# Patient Record
Sex: Male | Born: 1990
Health system: Southern US, Community
[De-identification: ages and names within clinical notes are randomized; demographics above are authoritative.]

## PROBLEM LIST (undated history)

## (undated) DIAGNOSIS — F3160 Bipolar disorder, current episode mixed, unspecified: Secondary | ICD-10-CM

## (undated) DIAGNOSIS — I454 Nonspecific intraventricular block: Secondary | ICD-10-CM

## (undated) HISTORY — PX: HERNIA REPAIR: SHX51

---
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
## 2009-09-27 ENCOUNTER — Emergency Department (HOSPITAL_COMMUNITY): Admission: EM | Admit: 2009-09-27 | Discharge: 2009-09-27 | Payer: Self-pay | Admitting: Emergency Medicine

## 2010-05-09 ENCOUNTER — Ambulatory Visit: Payer: Self-pay | Admitting: Pulmonary Disease

## 2010-05-09 ENCOUNTER — Inpatient Hospital Stay (HOSPITAL_COMMUNITY): Admission: EM | Admit: 2010-05-09 | Discharge: 2010-05-13 | Payer: Self-pay | Admitting: Emergency Medicine

## 2010-05-11 ENCOUNTER — Ambulatory Visit: Payer: Self-pay | Admitting: Psychiatry

## 2010-05-12 ENCOUNTER — Ambulatory Visit: Payer: Self-pay | Admitting: Psychiatry

## 2010-12-30 LAB — CBC
HCT: 39.2 % (ref 39.0–52.0)
HCT: 42.7 % (ref 39.0–52.0)
MCH: 30.4 pg (ref 26.0–34.0)
MCH: 30.6 pg (ref 26.0–34.0)
MCHC: 34.2 g/dL (ref 30.0–36.0)
MCHC: 35 g/dL (ref 30.0–36.0)
MCV: 88.5 fL (ref 78.0–100.0)
MCV: 88.8 fL (ref 78.0–100.0)
Platelets: 130 10*3/uL — ABNORMAL LOW (ref 150–400)
Platelets: 137 10*3/uL — ABNORMAL LOW (ref 150–400)
RBC: 4.03 MIL/uL — ABNORMAL LOW (ref 4.22–5.81)
RDW: 13.8 % (ref 11.5–15.5)
RDW: 13.8 % (ref 11.5–15.5)
RDW: 13.9 % (ref 11.5–15.5)

## 2010-12-30 LAB — BLOOD GAS, ARTERIAL
Acid-Base Excess: 0.2 mmol/L (ref 0.0–2.0)
Acid-Base Excess: 0.7 mmol/L (ref 0.0–2.0)
Acid-Base Excess: 1.8 mmol/L (ref 0.0–2.0)
Bicarbonate: 24.4 mEq/L — ABNORMAL HIGH (ref 20.0–24.0)
Drawn by: 295031
Drawn by: 326301
FIO2: 0.3 %
FIO2: 0.4 %
MECHVT: 500 mL
MECHVT: 640 mL
O2 Content: 0.4 L/min
O2 Saturation: 98.1 %
O2 Saturation: 99.3 %
RATE: 12 resp/min
RATE: 12 resp/min
RATE: 14 resp/min
TCO2: 21.1 mmol/L (ref 0–100)
TCO2: 21.2 mmol/L (ref 0–100)
pCO2 arterial: 34.4 mmHg — ABNORMAL LOW (ref 35.0–45.0)
pH, Arterial: 7.446 (ref 7.350–7.450)
pO2, Arterial: 182 mmHg — ABNORMAL HIGH (ref 80.0–100.0)

## 2010-12-30 LAB — COMPREHENSIVE METABOLIC PANEL
ALT: 13 U/L (ref 0–53)
Albumin: 3.6 g/dL (ref 3.5–5.2)
Albumin: 4.3 g/dL (ref 3.5–5.2)
Alkaline Phosphatase: 36 U/L — ABNORMAL LOW (ref 39–117)
BUN: 8 mg/dL (ref 6–23)
Calcium: 9 mg/dL (ref 8.4–10.5)
Chloride: 110 mEq/L (ref 96–112)
Creatinine, Ser: 1.03 mg/dL (ref 0.4–1.5)
Glucose, Bld: 103 mg/dL — ABNORMAL HIGH (ref 70–99)
Glucose, Bld: 87 mg/dL (ref 70–99)
Sodium: 140 mEq/L (ref 135–145)
Total Bilirubin: 2.4 mg/dL — ABNORMAL HIGH (ref 0.3–1.2)
Total Protein: 6.8 g/dL (ref 6.0–8.3)

## 2010-12-30 LAB — ETHANOL: Alcohol, Ethyl (B): <5 mg/dL (ref 0–10)

## 2010-12-30 LAB — DIFFERENTIAL
Basophils Relative: 0 % (ref 0–1)
Eosinophils Relative: 3 % (ref 0–5)
Lymphocytes Relative: 30 % (ref 12–46)
Monocytes Absolute: 0.4 10*3/uL (ref 0.1–1.0)
Neutrophils Relative %: 61 % (ref 43–77)

## 2010-12-30 LAB — URINALYSIS, ROUTINE W REFLEX MICROSCOPIC
Bilirubin Urine: NEGATIVE
Specific Gravity, Urine: 1.017 (ref 1.005–1.030)
pH: 5.5 (ref 5.0–8.0)

## 2010-12-30 LAB — CK TOTAL AND CKMB (NOT AT ARMC): Total CK: 145 U/L (ref 7–232)

## 2010-12-30 LAB — POCT I-STAT, CHEM 8
Calcium, Ion: 1.06 mmol/L — ABNORMAL LOW (ref 1.12–1.32)
Creatinine, Ser: 0.9 mg/dL (ref 0.4–1.5)
Hemoglobin: 14.6 g/dL (ref 13.0–17.0)
Sodium: 140 mEq/L (ref 135–145)
TCO2: 25 mmol/L (ref 0–100)

## 2010-12-30 LAB — BASIC METABOLIC PANEL
BUN: 7 mg/dL (ref 6–23)
CO2: 27 mEq/L (ref 19–32)
Calcium: 8.5 mg/dL (ref 8.4–10.5)
Chloride: 111 mEq/L (ref 96–112)
Creatinine, Ser: 0.93 mg/dL (ref 0.4–1.5)
GFR calc Af Amer: >60 mL/min (ref >60)
Glucose, Bld: 93 mg/dL (ref 70–99)

## 2010-12-30 LAB — RAPID URINE DRUG SCREEN, HOSP PERFORMED
Barbiturates: NOT DETECTED
Benzodiazepines: POSITIVE — AB
Cocaine: NOT DETECTED

## 2010-12-30 LAB — SALICYLATE LEVEL: Salicylate Lvl: <4 mg/dL (ref 2.8–20.0)

## 2010-12-30 LAB — URINE CULTURE

## 2010-12-30 LAB — MAGNESIUM: Magnesium: 1.8 mg/dL (ref 1.5–2.5)

## 2010-12-30 LAB — POCT CARDIAC MARKERS: Myoglobin, poc: 83.1 ng/mL (ref 12–200)

## 2010-12-30 LAB — PHOSPHORUS: Phosphorus: 3.2 mg/dL (ref 2.3–4.6)

## 2010-12-30 LAB — URINE MICROSCOPIC-ADD ON

## 2010-12-30 LAB — PROTIME-INR: INR: 1.21 (ref 0.00–1.49)

## 2010-12-30 LAB — APTT: aPTT: 23 seconds — ABNORMAL LOW (ref 24–37)

## 2010-12-30 LAB — ACETAMINOPHEN LEVEL: Acetaminophen (Tylenol), Serum: <10 ug/mL — ABNORMAL LOW (ref 10–30)

## 2010-12-30 LAB — LACTIC ACID, PLASMA: Lactic Acid, Venous: 0.6 mmol/L (ref 0.5–2.2)

## 2011-01-16 LAB — BASIC METABOLIC PANEL WITH GFR
BUN: 8 mg/dL (ref 6–23)
CO2: 29 meq/L (ref 19–32)
Calcium: 9.3 mg/dL (ref 8.4–10.5)
Chloride: 105 meq/L (ref 96–112)
Creatinine, Ser: 1.11 mg/dL (ref 0.4–1.5)
GFR calc non Af Amer: >60 mL/min
Glucose, Bld: 76 mg/dL (ref 70–99)
Potassium: 3.7 meq/L (ref 3.5–5.1)
Sodium: 139 meq/L (ref 135–145)

## 2011-01-16 LAB — CBC
HCT: 44.7 % (ref 39.0–52.0)
Hemoglobin: 14.9 g/dL (ref 13.0–17.0)
MCHC: 33.3 g/dL (ref 30.0–36.0)
MCV: 86.5 fL (ref 78.0–100.0)
Platelets: 226 K/uL (ref 150–400)
RBC: 5.17 MIL/uL (ref 4.22–5.81)
RDW: 13.5 % (ref 11.5–15.5)
WBC: 4.7 K/uL (ref 4.0–10.5)

## 2011-01-16 LAB — RAPID URINE DRUG SCREEN, HOSP PERFORMED
Amphetamines: NOT DETECTED
Barbiturates: NOT DETECTED
Benzodiazepines: NOT DETECTED
Cocaine: NOT DETECTED
Opiates: NOT DETECTED
Tetrahydrocannabinol: NOT DETECTED

## 2011-01-16 LAB — ETHANOL

## 2011-01-30 LAB — CBC
HCT: 47.4 % (ref 39.0–52.0)
Platelets: 260 10*3/uL (ref 150–400)
RBC: 5.56 MIL/uL (ref 4.22–5.81)
WBC: 12.9 10*3/uL — ABNORMAL HIGH (ref 4.0–10.5)

## 2011-01-30 LAB — BASIC METABOLIC PANEL
BUN: 9 mg/dL (ref 6–23)
Creatinine, Ser: 1.1 mg/dL (ref 0.4–1.5)
GFR calc Af Amer: >60 mL/min (ref >60)
GFR calc non Af Amer: >60 mL/min (ref >60)
Potassium: 3.9 mEq/L (ref 3.5–5.1)

## 2011-01-30 LAB — POCT TOXICOLOGY PANEL

## 2011-01-30 LAB — DIFFERENTIAL
Lymphocytes Relative: 17 % (ref 12–46)
Lymphs Abs: 2.1 10*3/uL (ref 0.7–4.0)
Monocytes Relative: 7 % (ref 3–12)
Neutrophils Relative %: 75 % (ref 43–77)

## 2011-01-30 LAB — ETHANOL: Alcohol, Ethyl (B): <10 mg/dL (ref 0–10)

## 2011-01-30 LAB — SALICYLATE LEVEL: Salicylate Lvl: 1 mg/dL — ABNORMAL LOW (ref 2.8–20.0)

## 2011-01-30 LAB — ACETAMINOPHEN LEVEL: Acetaminophen (Tylenol), Serum: <10 ug/mL — ABNORMAL LOW (ref 10–30)
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
## 2011-02-27 NOTE — H&P (Signed)
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   CHIEF COMPLAINT:  Depression, self-medication with drug of abuse and  suicidal thoughts and passive plans for crashing car.   HISTORY OF PRESENT ILLNESS:  The patient reported that the he has been  with his usual happy, uncomplicated, life until a month ago which his  depression started.  Patient reported he has been depressed over a month  and he described his depression has been feeling sad, unhappy, feeling  down, irritable, snappy at family members and friends, feeling lonely,  isolated, decreased socialization, disturbed sleep, decreased appetite,  no change of weight loss, and fleeting thoughts about killing himself by  crashing car in the traffic.  He also reported that he feels hopeless,  helpless, worthless, unable to help others.  Patient reported he started  self-medicating by drinking beer 2-12 cans a day for 3-4 times a week  and smoking marijuana, unknown quantity, twice a week, and also smoking  tobacco 1/2-pack a day for about 10 days ago.  The patient reported that  he could not tolerate the emotional part of him breaking up from his  girlfriend and the death of his best friend's father.  The patient  reported that he has a good relationship going on with his girlfriend  who he  knows about 5 years, and he has a relationship about 1 year and 8  months.  Patient reportedly his girlfriend decided not to continue a  relationship with him secondary to she wanted to be free from the  relationship and continue with her relationship with her friends.  Patient has no option except breaking up.  He is more jealous about her  going with other 3 friends.  He tried to numb his pain, tried to ignore  but unable to control his own emotions towards his girlfriend.  The  patient reported on top of his situation his best friend's dad, who he  knows for a long time, has been involved with on a weekly basis in  church activities and playground, passed away unexpectedly secondary to  heart attack.  Patient has been involved with the process of funeral  which made him more depressed.  On top of that he heard negative things  about his ex-girlfriend and he could not tolerate and took a pocket  knife and cut on his right arm 2 inches width, which required treatment  in emergency department.  Reportedly, he has been very anxious, nervous,  agitated, could not sleep  and received Ativan in the emergency  department, reportedly relieved temporarily his anxiety and agitation.  Patient's family is very supportive of him and his treatment.  The  patient is considered to be dangerous to himself and required acute  emergency psychiatric treatment.   PAST PSYCHIATRIC HISTORY:  Was not significant.  He has never been  admitted to the psychiatric hospitalization, nor has he received  outpatient either counseling or psychiatric treatment.  He has a primary  care physician, Dr. Mikael Spray.   PAST MEDICAL HISTORY:  Patient has been physically healthy without  chronic medical condition.  He has no history of head injuries,  seizures, motor vehicle accidents or surgeries.   He has no known drug allergies.   CURRENT MEDICATION:  Ativan 0.5 mg, started in [facility] emergency  department, and his  mother requested to continue the medication in the  hospital.   PATIENT CIRCUMSTANCE:  The patient likes his family, friends,  socialization, he loves hunting along with his father usually with bows  and squirrels, and he likes Holiday representative work and building along with  his friends.  He likes playing soccer and basketball, plays his X-Box.  He is an intelligent young boy who makes A/B honors most of his school  years.   FAMILY/SOCIAL HISTORY:  Patient has been living with his biological  parents.  One older brother 64 years old, 1 younger brother 38 years  old.  He has a 62 year-old sister who went to [place]. for higher  studies.  Patient's mom is a homemaker.  Patient's dad works for  [employer], and his brothers were in school.  Patient  reportedly has no history of mental health problems or substance abuse  problems in his immediate family.  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  The patient has first experience with breaking up with his  girlfriend and his first exposure to the death of a close family friend.   MENTAL STATUS EXAM:  This is a tall, slender, young Caucasian boy  casually dressed, fairly groomed and has decreased psychomotor activity.  He is calm, quiet and very cooperative with this evaluation.  He has the  stated mood of unhappy and sad and he has a constricted affect.  He has  monotonous low-volume speech with a normal rate.  He has linear and goal-  directed thought process without tangentiality or circumstantiality.  He  has fleeting passive suicidal thoughts, ideations and plans of crashing  his car while driving into the traffic but he denied active suicidal  ideations and plans.  Reportedly, his thoughts are coming and going over  a month.  He denied auditory visual hallucinations, delusions and  paranoia.  He is an intellectual, Manufacturing systems engineer with fair insight  but poor judgment and impulse controls.   ADMITTING  DIAGNOSES:  AXIS I:  1. Major depressive disorder, single, severe, without psychotic      features.  2. Marijuana abuse.  3. Alcohol abuse.  4. Adjustment disorder with mixed disturbance of emotions and conduct.  5. Relationship problems.  AXIS II:  Deferred.  AXIS III:  None.  AXIS IV:  Problems with poor social or coping skills, problems with  relationship, broke up with girlfriend about a month ago, and loss of  best friend's Dad.  AXIS V:  Global Assessment of Functioning less than 35.   ESTIMATED LENGTH OF INPATIENT TREATMENT:  Five to 7 days.   INITIAL DISCHARGE PLAN:  Home.   INITIAL PLAN OF CARE:  The patient.  Patient was admitted to the [facility] voluntarily emergently from the [facility] emergency department to the adolescent male psychiatric  locked facility for safety and providing secure therapeutic milieu.  Patient will be receiving multidisciplinary, multitherapeutic  interventions including individual therapy, group therapy, family  interventions, coping skills, psychoeducation, education about  medication, and medication management with the informed consent from the  parents.  Patient will be discharged upon free from suicidal ideations,  intentions and plans and when able to tolerate to receive outpatient  psychiatric services and possible substance abuse treatment.  Dr.  Marlyne Beards will be attending physician on this case.      Conni Slipper, MD  Electronically Signed     JRJ/MEDQ  D:  05/02/2008  T:  05/02/2008  Job:  [id number]

## 2011-02-27 NOTE — H&P (Signed)
NAME:  Eric Fields, Eric Fields              ACCOUNT NO.:  [id number]   MEDICAL RECORD NO.:  [id number]          PATIENT TYPE:  IPS   LOCATION:  0300                          FACILITY:  [facility]   PHYSICIAN:  Jasmine Pang, M.D. DATE OF BIRTH:  12-20-1990   DATE OF ADMISSION:  11/19/2008  DATE OF DISCHARGE:                       PSYCHIATRIC ADMISSION ASSESSMENT   HISTORY OF PRESENT ILLNESS:  The patient is here after an overdose on  Mucinex tablets.  Reports taking two initially to get high, but then  taking eight more.  Reporting thoughts of wanting to hurt himself, being  tired of the world.  He was found by his mother.  He apparently was  high at the time.  Got his mother very upset.  Told her what he did and  was taken to the emergency room for further assessment.  The patient  reports feeling depressed for some period of time.  He denies any  specific stressors at this time.   PAST PSYCHIATRIC HISTORY:  The patient was here in July 2009 on the  child adolescent unit.  Has been on Concerta in the past.  Reports being  diagnosed with bipolar in the past.  The patient was seeing a therapist  at the time, but felt he did not connect with that person.   SOCIAL HISTORY:  An 20 year old single male who lives in [place].  Is  currently unemployed, lives with his parents.   FAMILY HISTORY:  Father bipolar, grandfather bipolar.   ALCOHOL AND DRUG HABITS:  The patient smokes, denies any current drug  use.  Primary care Trinitey Roache is unknown.   MEDICAL PROBLEMS:  Denies any acute or chronic health issues.  Did  report that he had an enlarged lymph node.   MEDICATIONS:  None prior to this admission.   DRUG ALLERGIES:  NO KNOWN ALLERGIES.   PHYSICAL EXAMINATION:  GENERAL:  This is a slender young male who was  fully assessed at [facility] emergency room.  Note that  patient was initially drowsy but able to follow commands.  Negative  lymphadenopathy.  VITAL SIGNS:  Temperature 98.7,  88 heart rate, 20 respirations, blood  pressure is 145/77, 99% saturated.   LABORATORY DATA:  Shows a WBC count of 12.8, salicylate level of one.  B-  met within normal limits.  Urine drug screen negative.  Alcohol level  less than 10.  The patient appears in no acute distress and offers no  complaints at this time.   MENTAL STATUS EXAM:  He is a fully alert, cooperative young man,  casually dressed, good eye contact.  Speech is clear, normal pace and  tone.  The patient feels guilty and ashamed of what he did.  Depressed.  The patient does appear depressed, also gets tearful at times,  apologizing for him breaking down during the interview.  Though  processes are coherent and goal directed.  No delusional statements.  Promises safety.  Mom anxious to go home.  Cognitive function intact.  His memory is good.  Judgment insight is fair.   DIAGNOSES:  AXIS I:  Mood disorder, followed by  bipolar disorder.  AXIS II:  Deferred.  AXIS III:  Status post Mucinex overdose.  AXIS IV:  Problems with education, other psychosocial problems.  AXIS V:  Current is 35.   PLAN:  Plan is to initiate Lamictal for mood stabilization.  Risk and  benefits of medication was discussed.  The patient is agreeable to  beginning medications.  We will need to do a family session with his  parents and will be scheduled per the counselor.  The patient may  benefit from individual counseling.  His tentative length of stay is 2-3  days.      Landry Corporal, N.P.      Jasmine Pang, M.D.  Electronically Signed    JO/MEDQ  D:  11/22/2008  T:  11/22/2008  Job:  [id number]

## 2011-03-02 NOTE — Discharge Summary (Signed)
NAME:  Eric Fields, Eric Fields              ACCOUNT NO.:  [id number]   MEDICAL RECORD NO.:  [id number]          PATIENT TYPE:  IPS   LOCATION:  0300                          FACILITY:  [facility]   PHYSICIAN:  Jasmine Pang, M.D. DATE OF BIRTH:  Jul 16, 1991   DATE OF ADMISSION:  11/19/2008  DATE OF DISCHARGE:  11/22/2008                               DISCHARGE SUMMARY   IDENTIFICATION:  This is an 20 year old single male who lives in  [place].  He is currently unemployed and lives with his parents.   HISTORY OF PRESENT ILLNESS:  The patient is here after an overdose on  Mucinex tablets.  He reports taking 2 initially to get high, but then  taking 8 more.  He was reporting thoughts of wanting to hurt himself.  He states he was tired of the world.  He was found by his mother.  He  apparently was high at that time.  His mother became concerned and he  told her what he had done.  He was then taken to the emergency room for  further assessment.  The patient reports feeling depressed for some  period of time.  He denies any specific stressors at this time.   PAST PSYCHIATRIC HISTORY:  The patient was here in July 2009 at the  child and adolescent units.  He has been on Concerta in the past.  He  reports being diagnosed with bipolar disorder in the past.  The patient  was seeing a therapist at that time, but felt he did not connect with  that person.   FAMILY HISTORY:  Father is bipolar, grandfather bipolar.   ALCOHOL AND DRUG HABITS:  The patient smokes.  Denies current drug use.   MEDICAL PROBLEMS:  He denies any acute or chronic health issues.  He did  report that he had an enlarged lymph node.   MEDICATIONS:  None prior to this admission.   DRUG ALLERGIES:  No known drug allergies.   PHYSICAL FINDINGS:  There were no acute physical or medical problems  noted.  The patient was fully assessed at the [facility] Emergency Room.   Laboratory data shows a WBC count of 12.8;  salicylate level of 1.  BMET  was within normal limits.  Urine drug screen was negative.  Alcohol  level was less and 10.  The patient appears in no acute distress and  offers no complaints at this time.   HOSPITAL COURSE:  Upon admission, the patient was started on Ambien 5 mg  p.o. q.h.s. p.r.n. insomnia may repeat x1 and Ativan 0.5 mg p.o. q.4  hours p.r.n. anxiety.  He was also started on Lamictal 25 mg p.o. daily.  In individual sessions, the patient was initially alert and cooperative,  but guarded and withdrawn.  He has positive suicidal ideation, but has  no plan now.  There was no psychosis.  Denies auditory hallucinations or  homicidal ideation.  On November 21, 2008, the patient stated he was  feeling better.  Parents visited yesterday and came up with a plan to  prevent this from happening  again.  The patient stated he was interested  in individual counseling.  He is tolerating Lamictal.  He denies  suicidal ideation.  He was bright and pleasant.  He had a family session  with his parents.  He reports that he is feeling much more levelheaded  and less up and down.  The patient reports he stopped going to his  therapy to see his psychiatrist and isolated himself from his family as  his depression increased.  Parents stated they want more communication  with him.  They are going to be more forceful with the patient in terms  of getting out of the house in going to his appointments.  The patient  stated the parents are very supportive and he cannot think of anything  else they could do to help.  On November 22, 2008, mental status improved  markedly from admission status.  Sleep was good.  Appetite was good.  Mood was euthymic.  Affect was consistent with mood.  There was no  suicidal or homicidal ideation.  No thoughts of self-injurious behavior.  No auditory or visual hallucinations.  No paranoia or delusions.  Thoughts were logical and goal-directed, thought content.  No   predominant theme.  Cognitive was grossly intact.  Insight good,  judgment good, impulse control good.  The patient wanted discharge  today.  After the family session, his parents felt comfortable with him  coming home.   DISCHARGE DIAGNOSES:  Axis I:  Mood disorder, not otherwise specified.  Axis II:  None.  Axis III:  Status post Mucinex overdose.  Axis IV:  Severe (problems with education, other psychosocial problems,  burden of psychiatric illness).  Axis V:  Global assessment of functioning was 50 upon discharge.  GAF  was 35 upon admission.  GAF was 60-65 highest past year.   DISCHARGE PLANS:  There was no specific activity level or dietary  restrictions.   POSTHOSPITAL CARE PLANS:  The patient will be seen at [facility] on November 24, 2008, at 1:30 p.m.  He will also get a  psychiatrist at this appointment.   DISCHARGE MEDICATIONS:  Lamictal 25 mg daily.      Jasmine Pang, M.D.  Electronically Signed     BHS/MEDQ  D:  12/08/2008  T:  12/09/2008  Job:  [id number]

## 2011-03-02 NOTE — Discharge Summary (Signed)
NAME:  READ, BONELLI              ACCOUNT NO.:  [id number]   MEDICAL RECORD NO.:  [id number]          PATIENT TYPE:  INP   LOCATION:  0200                          FACILITY:  [facility]   PHYSICIAN:  Carolanne Grumbling, M.D.    DATE OF BIRTH:  1991/06/03   DATE OF ADMISSION:  08/06/2007  DATE OF DISCHARGE:  08/11/2007                               DISCHARGE SUMMARY   IDENTIFICATION:  Dustine was a 20, almost 20 year old male.   INITIAL ASSESSMENT/DIAGNOSIS:  Cecil was admitted to the service of  Dr. Marlyne Beards.  I was on call the day of his discharge.  He was admitted  because he had been apparently obsessed with dying and what happens  after death.  He had told his mother that he intended to kill himself.  He also been underachieving at school and was not even going to school  recently.  He reportedly was controlling the family by his resistance to  things rather than making any effort to change things.  He was very  vague as to what was bothering him particularly, but nevertheless, his  threats to harm himself were taken seriously.   MENTAL STATUS EXAM:  At the time of the initial evaluation revealed an  alert, oriented young man who was not very cooperative.  He did not  answer questions for the most part.  Consequently, the mental status was  based more on his history than on his current presentation which seemed  to fit more anger and agitation than any revelation of what he is  seriously thinking and feeling.  There was no evidence of any psychosis  or mania.  He did have a history of suicidal intent and ideation.   ADMISSION DIAGNOSES:  AXIS I:  1. Major depression, single episode.  2. Generalized anxiety disorder.  3. Attention deficit hyperactivity disorder, combined.  4. Oppositional defiant disorder.  5. Rule out pyromania.  AXIS II:  Deferred.  AXIS III:  Healing burns on both arms.  History of migraine.  AXIS IV:  Severe.  AXIS V:  34/64.   LABORATORY DATA:  All indicated  laboratory examinations were within  normal limits or noncontributory.   HOSPITAL COURSE:  While in the hospital, Chett initially was very  resistant and reluctant to talk, but fairly quickly he began to be more  cooperative in the group process where the treatment is done for the  most part.  He began to accept some responsibility for his behavior, to  admit that he needed to make some changes himself on order to feel  better about his life and himself, and to stop blaming other people for  his problems.  He talked to his parents informally as well as formally.  On the day of discharge, they had a formal family session that went well  where he accepted responsibility for his behavior and indicated he  planned to do things, to follow the directions at home and at school.  He was feeling more optimistic about his future.  He was denying any  suicidal threats at that point and was consequently discharged home.  FINAL DIAGNOSES:  AXIS I:  1. Depressive disorder, not otherwise specified.  2. Rule out generalized anxiety disorder.  3. Attention deficit hyperactivity disorder, combined by history.  4. Oppositional defiant disorder.  AXIS II:  Deferred.  AXIS III:  Healthy.  AXIS IV:  Moderate.  AXIS V:  60/64.   DISCHARGE INSTRUCTIONS:  1. He was to follow up with Dr. Tomasa Rand with an appointment for      August 26, 2007.  2. Judithann Sauger with an appointment for August 18, 2007.  3. There were no restrictions placed on his activity or his diet.  4. He was taking Wellbutrin XL 300 mg daily in the morning.      Carolanne Grumbling, M.D.  Electronically Signed     GT/MEDQ  D:  08/18/2007  T:  08/19/2007  Job:  [id number]

## 2011-06-13 IMAGING — CR DG CHEST 1V PORT
1 series · 1 of 1 positions shown · non-contrast
Comparison: None

CLINICAL DATA: Drug overdose.  Ventilator.

PORTABLE CHEST - 1 VIEW

[series [date]]
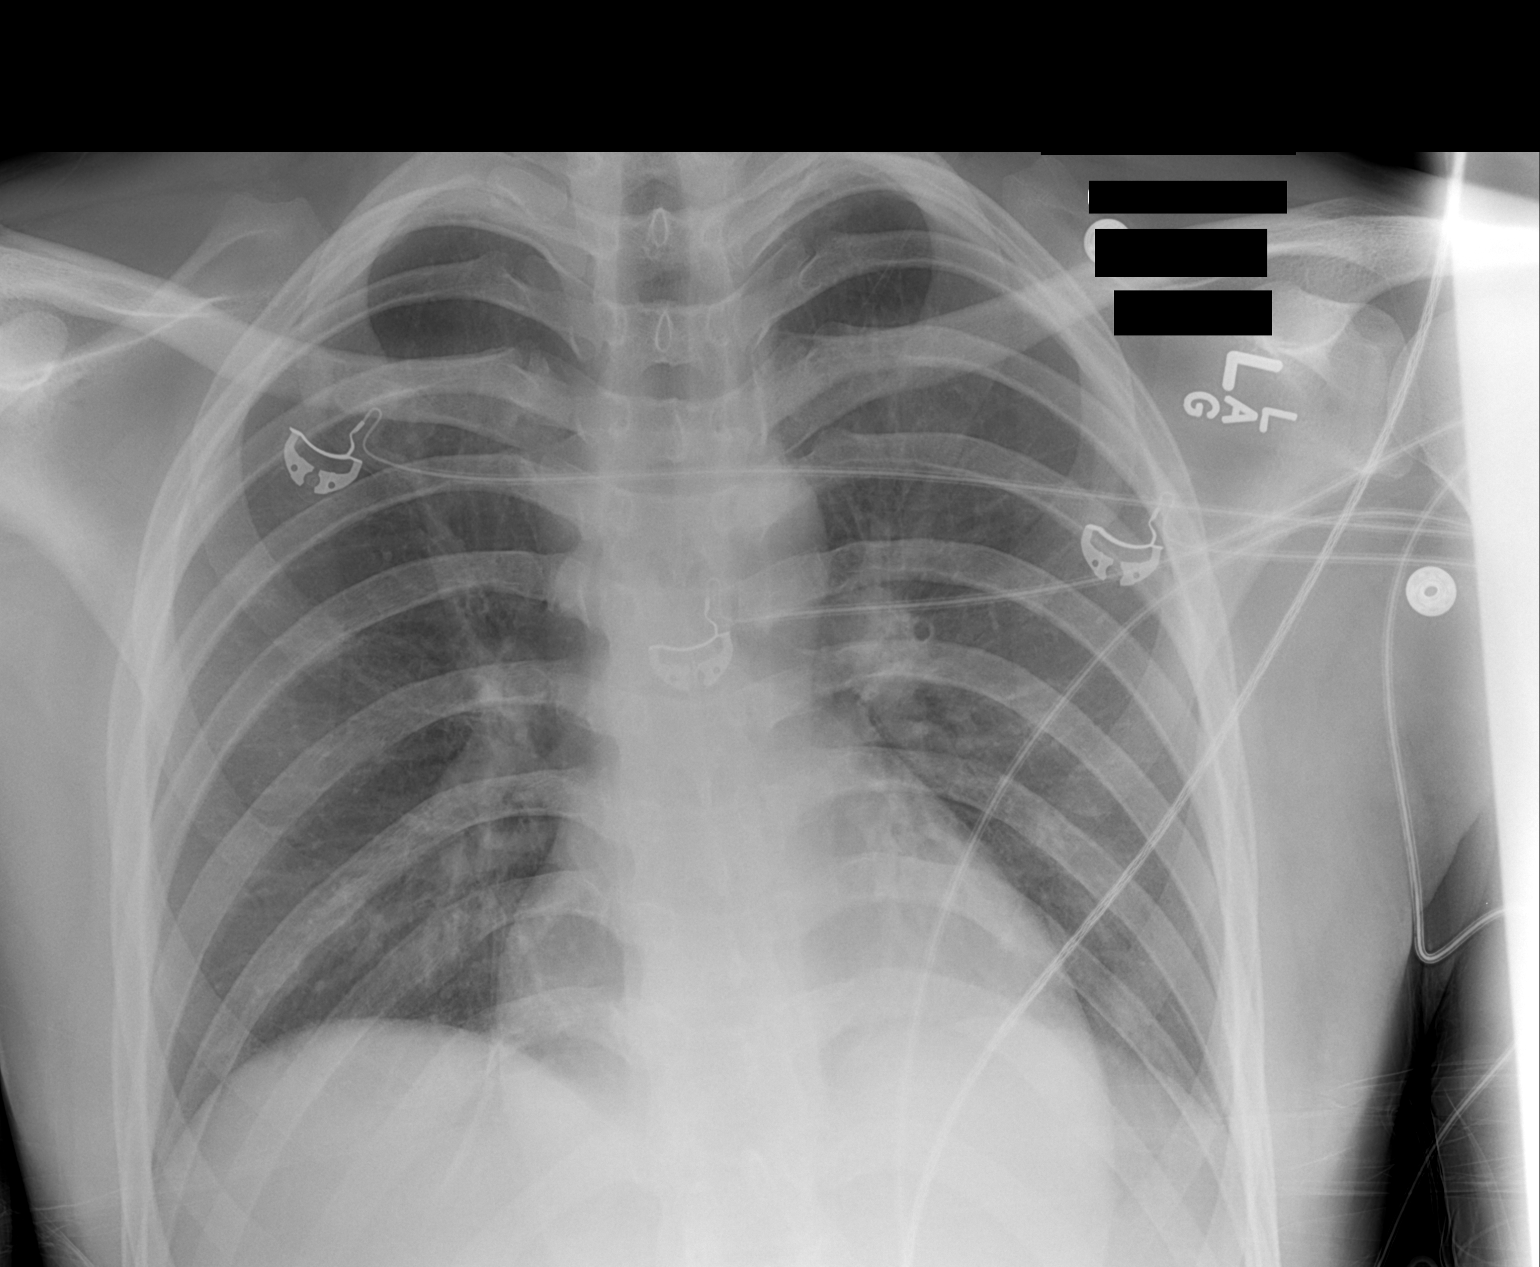

[1 of 1 positions shown; findings below may reference images not displayed]

FINDINGS: Interval removal of endotracheal tube and NG tube.  Lungs
are clear.  Heart is borderline in size.  No effusions.
IMPRESSION: Interval extubation.  No active disease.

## 2011-07-13 LAB — GAMMA GT: GGT: 34

## 2011-07-13 LAB — HEPATIC FUNCTION PANEL
ALT: 16
AST: 15
Albumin: 4.3
Alkaline Phosphatase: 94
Total Bilirubin: 0.9

## 2011-07-13 LAB — URINALYSIS, ROUTINE W REFLEX MICROSCOPIC
Bilirubin Urine: NEGATIVE
Glucose, UA: NEGATIVE
Hgb urine dipstick: NEGATIVE
Nitrite: NEGATIVE
Specific Gravity, Urine: 1.023
pH: 5.5

## 2011-07-25 LAB — URINALYSIS, ROUTINE W REFLEX MICROSCOPIC
Bilirubin Urine: NEGATIVE
Nitrite: NEGATIVE
Specific Gravity, Urine: 1.005
pH: 6

## 2011-07-25 LAB — HEPATIC FUNCTION PANEL
ALT: 12
AST: 16
Albumin: 4.2
Alkaline Phosphatase: 80
Bilirubin, Direct: 0.2
Indirect Bilirubin: 2.4 — ABNORMAL HIGH
Total Bilirubin: 2.6 — ABNORMAL HIGH
Total Protein: 7.2

## 2011-07-25 LAB — CBC
HCT: 44.1
Hemoglobin: 15.2
WBC: 8.7

## 2011-07-25 LAB — BASIC METABOLIC PANEL
Glucose, Bld: 82
Potassium: 4.5
Sodium: 138

## 2011-07-25 LAB — DIFFERENTIAL
Basophils Absolute: 0
Basophils Relative: 0
Eosinophils Absolute: 0.1
Eosinophils Relative: 2
Lymphocytes Relative: 26
Lymphs Abs: 2.2
Monocytes Absolute: 0.6
Monocytes Relative: 7
Neutro Abs: 5.7
Neutrophils Relative %: 65

## 2011-07-25 LAB — GC/CHLAMYDIA PROBE AMP, URINE
Chlamydia, Swab/Urine, PCR: NEGATIVE
GC Probe Amp, Urine: NEGATIVE

## 2011-07-25 LAB — T4, FREE: Free T4: 1.44

## 2011-07-25 LAB — DRUGS OF ABUSE SCREEN W/O ALC, ROUTINE URINE
Amphetamine Screen, Ur: NEGATIVE
Benzodiazepines.: NEGATIVE
Marijuana Metabolite: NEGATIVE
Propoxyphene: NEGATIVE

## 2011-07-25 LAB — GAMMA GT: GGT: 17

## 2014-01-11 ENCOUNTER — Emergency Department (HOSPITAL_BASED_OUTPATIENT_CLINIC_OR_DEPARTMENT_OTHER): Payer: BC Managed Care – PPO

## 2014-01-11 ENCOUNTER — Encounter (HOSPITAL_BASED_OUTPATIENT_CLINIC_OR_DEPARTMENT_OTHER): Payer: Self-pay | Admitting: Emergency Medicine

## 2014-01-11 ENCOUNTER — Emergency Department (HOSPITAL_BASED_OUTPATIENT_CLINIC_OR_DEPARTMENT_OTHER)
Admission: EM | Admit: 2014-01-11 | Discharge: 2014-01-11 | Disposition: A | Discharge status: Home / Self Care | Payer: BC Managed Care – PPO | Attending: Emergency Medicine | Admitting: Emergency Medicine

## 2014-01-11 DIAGNOSIS — Y9389 Activity, other specified: Secondary | ICD-10-CM | POA: Insufficient documentation

## 2014-01-11 DIAGNOSIS — S60229A Contusion of unspecified hand, initial encounter: Secondary | ICD-10-CM | POA: Insufficient documentation

## 2014-01-11 DIAGNOSIS — IMO0002 Reserved for concepts with insufficient information to code with codable children: Secondary | ICD-10-CM | POA: Insufficient documentation

## 2014-01-11 DIAGNOSIS — Y929 Unspecified place or not applicable: Secondary | ICD-10-CM | POA: Insufficient documentation

## 2014-01-11 HISTORY — DX: Bipolar disorder, current episode mixed, unspecified: F31.60

## 2014-01-11 HISTORY — DX: Nonspecific intraventricular block: I45.4

## 2014-01-11 NOTE — ED Provider Notes (Signed)
CSN: [id number]     Arrival date & time 01/11/14  [id number] History   First MD Initiated Contact with Patient 01/11/14 [phone]     No chief complaint on file.    (Consider location/radiation/quality/duration/timing/severity/associated sxs/prior Treatment) HPI Comments: Pt states that he punched a wall a short time ago and he immediately got swelling and pain to the hand below his thumb. Pt states that he is able to move fingers and wrist without any problem. Unsure if previous injury to this hand.  The history is provided by the patient. No language interpreter was used.    No past medical history on file. No past surgical history on file. No family history on file. History  Substance Use Topics  . Smoking status: Not on file  . Smokeless tobacco: Not on file  . Alcohol Use: Not on file    Review of Systems  Constitutional: Negative.   Respiratory: Negative.   Cardiovascular: Negative.       Allergies  Review of patient's allergies indicates not on file.  Home Medications  No current outpatient prescriptions on file. BP 139/84  Pulse 96  Temp(Src) 98.7 F (37.1 C) (Oral)  Resp 16  SpO2 100% Physical Exam  Nursing note and vitals reviewed. Constitutional: He is oriented to person, place, and time. He appears well-developed and well-nourished.  Cardiovascular: Normal rate and regular rhythm.   Pulmonary/Chest: Effort normal and breath sounds normal.  Musculoskeletal:  Large amount of swelling noted to the "snuff box area":pt has full rom of hand and wrist on the left side.neurovascularly intact  Neurological: He is alert and oriented to person, place, and time.  Skin: Skin is warm and dry.    ED Course  Procedures (including critical care time) Labs Review Labs Reviewed - No data to display Imaging Review Dg Wrist Complete Left  01/11/2014   CLINICAL DATA:  Left wrist injury with pain.  EXAM: LEFT WRIST - COMPLETE 3+ VIEW  COMPARISON:  None.  FINDINGS: No acute  fracture or dislocation is identified. No significant arthropathy. No focal lesions are identified.  IMPRESSION: No acute fracture.   Electronically Signed   By: Irish LackGlenn  Yamagata M.D.   On: 01/11/2014 10:03   Dg Hand Complete Left  01/11/2014   CLINICAL DATA:  Injury left hand with pain.  EXAM: LEFT HAND - COMPLETE 3+ VIEW  COMPARISON:  None.  FINDINGS: No acute fracture or dislocation is identified. No significant arthropathy is seen. Soft tissues show swelling without evidence of foreign body. No bony lesions are seen.  IMPRESSION: No acute fracture identified.   Electronically Signed   By: Irish LackGlenn  Yamagata M.D.   On: 01/11/2014 10:02     EKG Interpretation None      MDM   Final diagnoses:  Hand contusion    No bony abnormality noted:neurovascularly intact. Pt wrapped for comfort and swelling. Hand follow up as needed    Teressa LowerVrinda Amine Adelson, NP 01/11/14 1009

## 2014-01-11 NOTE — ED Notes (Signed)
Parents of child states child had a bad dream last night and was talking it out with his father, when he became so upset that he hit a wall.  Pain and swelling to left dorsal hand.

## 2014-01-11 NOTE — Discharge Instructions (Signed)
Take tylenol or motrin for pain Contusion A contusion is a deep bruise. Contusions are the result of an injury that caused bleeding under the skin. The contusion may turn blue, purple, or yellow. Minor injuries will give you a painless contusion, but more severe contusions may stay painful and swollen for a few weeks.  CAUSES  A contusion is usually caused by a blow, trauma, or direct force to an area of the body. SYMPTOMS   Swelling and redness of the injured area.  Bruising of the injured area.  Tenderness and soreness of the injured area.  Pain. DIAGNOSIS  The diagnosis can be made by taking a history and physical exam. An X-ray, CT scan, or MRI may be needed to determine if there were any associated injuries, such as fractures. TREATMENT  Specific treatment will depend on what area of the body was injured. In general, the best treatment for a contusion is resting, icing, elevating, and applying cold compresses to the injured area. Over-the-counter medicines may also be recommended for pain control. Ask your caregiver what the best treatment is for your contusion. HOME CARE INSTRUCTIONS   Put ice on the injured area.  Put ice in a plastic bag.  Place a towel between your skin and the bag.  Leave the ice on for 15-20 minutes, 03-04 times a day.  Only take over-the-counter or prescription medicines for pain, discomfort, or fever as directed by your caregiver. Your caregiver may recommend avoiding anti-inflammatory medicines (aspirin, ibuprofen, and naproxen) for 48 hours because these medicines may increase bruising.  Rest the injured area.  If possible, elevate the injured area to reduce swelling. SEEK IMMEDIATE MEDICAL CARE IF:   You have increased bruising or swelling.  You have pain that is getting worse.  Your swelling or pain is not relieved with medicines. MAKE SURE YOU:   Understand these instructions.  Will watch your condition.  Will get help right away if you  are not doing well or get worse. Document Released: 07/11/2005 Document Revised: 12/24/2011 Document Reviewed: 08/06/2011 [facility] Patient Information 2014 [place], [place].

## 2014-01-12 NOTE — ED Provider Notes (Signed)
Medical screening examination/treatment/procedure(s) were performed by non-physician practitioner and as supervising physician I was immediately available for consultation/collaboration.   EKG Interpretation None        Hiran Leard W. Niyanna Asch, MD 01/12/14 0737 

## 2015-12-13 ENCOUNTER — Emergency Department (HOSPITAL_BASED_OUTPATIENT_CLINIC_OR_DEPARTMENT_OTHER)
Admission: EM | Admit: 2015-12-13 | Discharge: 2015-12-13 | Disposition: A | Discharge status: Home / Self Care | Payer: 59 | Attending: Physician Assistant | Admitting: Physician Assistant

## 2015-12-13 ENCOUNTER — Emergency Department (HOSPITAL_BASED_OUTPATIENT_CLINIC_OR_DEPARTMENT_OTHER): Payer: 59

## 2015-12-13 ENCOUNTER — Encounter (HOSPITAL_BASED_OUTPATIENT_CLINIC_OR_DEPARTMENT_OTHER): Payer: Self-pay | Admitting: *Deleted

## 2015-12-13 DIAGNOSIS — Z8679 Personal history of other diseases of the circulatory system: Secondary | ICD-10-CM | POA: Diagnosis not present

## 2015-12-13 DIAGNOSIS — A084 Viral intestinal infection, unspecified: Secondary | ICD-10-CM | POA: Diagnosis not present

## 2015-12-13 DIAGNOSIS — R1013 Epigastric pain: Secondary | ICD-10-CM | POA: Diagnosis present

## 2015-12-13 DIAGNOSIS — Z8659 Personal history of other mental and behavioral disorders: Secondary | ICD-10-CM | POA: Insufficient documentation

## 2015-12-13 DIAGNOSIS — F17210 Nicotine dependence, cigarettes, uncomplicated: Secondary | ICD-10-CM | POA: Insufficient documentation

## 2015-12-13 DIAGNOSIS — R42 Dizziness and giddiness: Secondary | ICD-10-CM | POA: Diagnosis not present

## 2015-12-13 LAB — COMPREHENSIVE METABOLIC PANEL
ALBUMIN: 6.3 g/dL — AB (ref 3.5–5.0)
ALT: 15 U/L — ABNORMAL LOW (ref 17–63)
ANION GAP: 14 (ref 5–15)
AST: 20 U/L (ref 15–41)
Alkaline Phosphatase: 61 U/L (ref 38–126)
BILIRUBIN TOTAL: 3.3 mg/dL — AB (ref 0.3–1.2)
BUN: 13 mg/dL (ref 6–20)
CO2: 26 mmol/L (ref 22–32)
Calcium: 10.6 mg/dL — ABNORMAL HIGH (ref 8.9–10.3)
Chloride: 102 mmol/L (ref 101–111)
Creatinine, Ser: 1.25 mg/dL — ABNORMAL HIGH (ref 0.61–1.24)
GFR calc Af Amer: >60 mL/min (ref >60)
GFR calc non Af Amer: >60 mL/min (ref >60)
GLUCOSE: 129 mg/dL — AB (ref 65–99)
POTASSIUM: 3.3 mmol/L — AB (ref 3.5–5.1)
SODIUM: 142 mmol/L (ref 135–145)
TOTAL PROTEIN: 9.5 g/dL — AB (ref 6.5–8.1)

## 2015-12-13 LAB — CBC WITH DIFFERENTIAL/PLATELET
Basophils Absolute: 0 10*3/uL (ref 0.0–0.1)
Basophils Relative: 0 %
EOS PCT: 1 %
Eosinophils Absolute: 0.1 10*3/uL (ref 0.0–0.7)
HEMATOCRIT: 56 % — AB (ref 39.0–52.0)
Hemoglobin: 19.2 g/dL — ABNORMAL HIGH (ref 13.0–17.0)
LYMPHS ABS: 0.6 10*3/uL — AB (ref 0.7–4.0)
LYMPHS PCT: 3 %
MCH: 28.7 pg (ref 26.0–34.0)
MCHC: 34.3 g/dL (ref 30.0–36.0)
MCV: 83.6 fL (ref 78.0–100.0)
MONO ABS: 1.5 10*3/uL — AB (ref 0.1–1.0)
MONOS PCT: 7 %
NEUTROS ABS: 20.1 10*3/uL — AB (ref 1.7–7.7)
Neutrophils Relative %: 90 %
PLATELETS: 260 10*3/uL (ref 150–400)
RBC: 6.7 MIL/uL — ABNORMAL HIGH (ref 4.22–5.81)
RDW: 16.1 % — AB (ref 11.5–15.5)
WBC: 22.3 10*3/uL — ABNORMAL HIGH (ref 4.0–10.5)

## 2015-12-13 LAB — LIPASE, BLOOD: Lipase: 19 U/L (ref 11–51)

## 2015-12-13 MED ORDER — ACETAMINOPHEN 325 MG PO TABS: 650.0000 mg | ORAL_TABLET | Freq: Once | ORAL | Status: DC

## 2015-12-13 MED ORDER — SODIUM CHLORIDE 0.9 % IV BOLUS (SEPSIS)
1000.0000 mL | Freq: Once | INTRAVENOUS | Status: AC
  Administered 2015-12-13: 1000 mL via INTRAVENOUS

## 2015-12-13 MED ORDER — IOHEXOL 300 MG/ML  SOLN
100.0000 mL | Freq: Once | INTRAMUSCULAR | Status: AC | PRN
  Administered 2015-12-13: 100 mL via INTRAVENOUS

## 2015-12-13 MED ORDER — ONDANSETRON HCL 4 MG PO TABS: 4.0000 mg | ORAL_TABLET | Freq: Three times a day (TID) | ORAL | Status: DC | PRN

## 2015-12-13 MED ORDER — SODIUM CHLORIDE 0.9 % IV SOLN: Freq: Once | INTRAVENOUS | Status: DC

## 2015-12-13 MED ORDER — POTASSIUM CHLORIDE 10 MEQ/100ML IV SOLN
10.0000 meq | Freq: Once | INTRAVENOUS | Status: AC
  Administered 2015-12-13: 10 meq via INTRAVENOUS
  Filled 2015-12-13: qty 100

## 2015-12-13 MED ORDER — ONDANSETRON HCL 4 MG/2ML IJ SOLN
4.0000 mg | Freq: Once | INTRAMUSCULAR | Status: AC
  Administered 2015-12-13: 4 mg via INTRAVENOUS
  Filled 2015-12-13: qty 2

## 2015-12-13 MED ORDER — SODIUM CHLORIDE 0.9 % IV BOLUS (SEPSIS)
1000.0000 mL | Freq: Once | INTRAVENOUS | Status: AC
Start: 2015-12-13 — End: 2015-12-13
  Administered 2015-12-13: 1000 mL via INTRAVENOUS

## 2015-12-13 MED ORDER — IOHEXOL 300 MG/ML  SOLN
25.0000 mL | Freq: Once | INTRAMUSCULAR | Status: AC | PRN
  Administered 2015-12-13: 25 mL via ORAL

## 2015-12-13 MED FILL — ONDANSETRON HCL 4 MG TABLET: 4 | 3 days supply | Qty: 11 | Fill #0

## 2015-12-13 NOTE — ED Notes (Signed)
Patient given gown to change into.

## 2015-12-13 NOTE — ED Notes (Signed)
MD at bedside. 

## 2015-12-13 NOTE — ED Notes (Signed)
Patient transported to and from radiology department via stretcher. 

## 2015-12-13 NOTE — ED Provider Notes (Signed)
CSN: [id number]     Arrival date & time 12/13/15  0725 History   First MD Initiated Contact with Patient 12/13/15 [phone]     Chief Complaint  Patient presents with  . Emesis     (Consider location/radiation/quality/duration/timing/severity/associated sxs/prior Treatment) HPI   Patient's age 25 year old male presenting with nausea vomiting diarrhea starting at 4 AM this morning. He woke up this morning and he was started with vomiting diarrhea. No blood. Patient's father had the same thing a couple of days ago and was diagnosed with viral gastroenteritis.   Patient has minimal epigastric pain also associated with vomiting.  Patient's had no fever that is noted. Unable to tolerate fluids. No urinary symptoms no cough congestion or upper respiratory symptoms.  Past Medical History  Diagnosis Date  . Bipolar affective, mixed (HCC)   . BBB (bundle branch block)     right   Past Surgical History  Procedure Laterality Date  . Hernia repair     History reviewed. No pertinent family history. Social History  Substance Use Topics  . Smoking status: Current Every Day Smoker -- 1.00 packs/day    Types: Cigarettes  . Smokeless tobacco: Never Used  . Alcohol Use: No    Review of Systems  Constitutional: Negative for fever and activity change.  HENT: Negative for congestion.   Respiratory: Negative for shortness of breath.   Cardiovascular: Negative for chest pain.  Gastrointestinal: Positive for nausea, vomiting and abdominal pain.  Genitourinary: Negative for dysuria.  Neurological: Positive for light-headedness.  Psychiatric/Behavioral: Negative for agitation.  All other systems reviewed and are negative.     Allergies  Review of patient's allergies indicates no known allergies.  Home Medications   Prior to Admission medications   Medication Sig Start Date End Date Taking? Authorizing Provider  ondansetron (ZOFRAN) 4 MG tablet Take 1 tablet (4 mg total) by mouth every 8  (eight) hours as needed for nausea or vomiting. 12/13/15   Mace Weinberg Lyn Cardell Rachel, MD   BP 133/80 mmHg  Pulse 88  Temp(Src) 97.6 F (36.4 C) (Oral)  Resp 16  Ht  (1.905 m)  Wt 160 lb (72.576 kg)  BMI 20.00 kg/m2  SpO2 100% Physical Exam  Constitutional: He is oriented to person, place, and time. He appears well-nourished.  Pale/yellow  HENT:  Head: Normocephalic.  Mouth/Throat: Oropharynx is clear and moist.  Eyes: Conjunctivae are normal.  Neck: No tracheal deviation present.  Cardiovascular: Normal rate.   Pulmonary/Chest: Effort normal. No stridor. No respiratory distress.  Abdominal: Soft. There is no tenderness. There is no guarding.  Mild epigastric tenderness  Musculoskeletal: Normal range of motion. He exhibits no edema.  Neurological: He is oriented to person, place, and time. No cranial nerve deficit.  Skin: Skin is warm and dry. No rash noted. He is not diaphoretic.  Psychiatric: He has a normal mood and affect. His behavior is normal.  Nursing note and vitals reviewed.   ED Course  Procedures (including critical care time) Labs Review Labs Reviewed  COMPREHENSIVE METABOLIC PANEL - Abnormal; Notable for the following:    Potassium 3.3 (*)    Glucose, Bld 129 (*)    Creatinine, Ser 1.25 (*)    Calcium 10.6 (*)    Total Protein 9.5 (*)    Albumin 6.3 (*)    ALT 15 (*)    Total Bilirubin 3.3 (*)    All other components within normal limits  CBC WITH DIFFERENTIAL/PLATELET - Abnormal; Notable for the following:  WBC 22.3 (*)    RBC 6.70 (*)    Hemoglobin 19.2 (*)    HCT 56.0 (*)    RDW 16.1 (*)    Neutro Abs 20.1 (*)    Lymphs Abs 0.6 (*)    Monocytes Absolute 1.5 (*)    All other components within normal limits  LIPASE, BLOOD    Imaging Review Ct Abdomen Pelvis W Contrast  12/13/2015  CLINICAL DATA:  Nausea and vomiting for several hours EXAM: CT ABDOMEN AND PELVIS WITH CONTRAST TECHNIQUE: Multidetector CT imaging of the abdomen and pelvis was  performed using the standard protocol following bolus administration of intravenous contrast. CONTRAST:  OMNIPAQUE IOHEXOL 300 MG/ML SOLN, 25mL OMNIPAQUE IOHEXOL 300 MG/ML SOLN COMPARISON:  None. FINDINGS: Lung bases are free of acute infiltrate or sizable effusion. The liver, gallbladder, spleen, adrenal glands and pancreas are normal in their CT appearance. The kidneys are well visualized bilaterally and reveal no evidence of renal calculi or urinary tract obstructive changes. The appendix is well visualized and within normal limits. No obstructive changes are noted in the large and small bowel. No significant inflammatory changes are seen. Fluid-filled loops of distal small bowel are noted consistent with the patient's given clinical history The bladder is well distended. The prostate is within normal limits. No pelvic mass lesion or sidewall abnormality is noted. The osseous structures show no acute abnormality. IMPRESSION: Fluid-filled loops of distal small bowel without significant dilatation. This is consistent with the patient's given clinical history and may represent a degree of mild enteritis. No significant inflammatory changes are noted. No other focal abnormality is noted. Electronically Signed   By: Alcide Clever M.D.   On: 12/13/2015 10:11   I have personally reviewed and evaluated these images and lab results as part of my medical decision-making.   EKG Interpretation None      MDM   Final diagnoses:  Viral gastroenteritis    Patient's age 25 year old male with 3 hours of nausea vomiting diarrhea. Patient's father had exactly the same thing 2 days ago. He was diagnosed with viral gastroenteritis. We'll give IV fluids, check CBC and Chem-7. Patient has no tenderness in lower quadrants on exam. Do not suspect any abdominal pathology. This is likely viral gastroenteritis. Patient had positiv sick contacts and similar illness has been going around in the community.  We'll help with  symptomatic care.   12:25 PM Elevated bili + pallor.  So I offered CT.  No cirhosis on CT. Will have him follow up with PCP and GI.  Vomiting has resolved. No abdominal pain. Taking PO. Will have him follow up prn with PCP and GI.   Laporshia Hogen Randall An, MD 12/13/15 1225

## 2015-12-13 NOTE — Discharge Instructions (Signed)
Return as needed.   Viral Gastroenteritis Viral gastroenteritis is also known as stomach flu. This condition affects the stomach and intestinal tract. It can cause sudden diarrhea and vomiting. The illness typically lasts 3 to 8 days. Most people develop an immune response that eventually gets rid of the virus. While this natural response develops, the virus can make you quite ill. CAUSES  Many different viruses can cause gastroenteritis, such as rotavirus or noroviruses. You can catch one of these viruses by consuming contaminated food or water. You may also catch a virus by sharing utensils or other personal items with an infected person or by touching a contaminated surface. SYMPTOMS  The most common symptoms are diarrhea and vomiting. These problems can cause a severe loss of body fluids (dehydration) and a body salt (electrolyte) imbalance. Other symptoms may include:  Fever.  Headache.  Fatigue.  Abdominal pain. DIAGNOSIS  Your caregiver can usually diagnose viral gastroenteritis based on your symptoms and a physical exam. A stool sample may also be taken to test for the presence of viruses or other infections. TREATMENT  This illness typically goes away on its own. Treatments are aimed at rehydration. The most serious cases of viral gastroenteritis involve vomiting so severely that you are not able to keep fluids down. In these cases, fluids must be given through an intravenous line (IV). HOME CARE INSTRUCTIONS   Drink enough fluids to keep your urine clear or pale yellow. Drink small amounts of fluids frequently and increase the amounts as tolerated.  Ask your caregiver for specific rehydration instructions.  Avoid:  Foods high in sugar.  Alcohol.  Carbonated drinks.  Tobacco.  Juice.  Caffeine drinks.  Extremely hot or cold fluids.  Fatty, greasy foods.  Too much intake of anything at one time.  Dairy products until 24 to 48 hours after diarrhea stops.  You  may consume probiotics. Probiotics are active cultures of beneficial bacteria. They may lessen the amount and number of diarrheal stools in adults. Probiotics can be found in yogurt with active cultures and in supplements.  Wash your hands well to avoid spreading the virus.  Only take over-the-counter or prescription medicines for pain, discomfort, or fever as directed by your caregiver. Do not give aspirin to children. Antidiarrheal medicines are not recommended.  Ask your caregiver if you should continue to take your regular prescribed and over-the-counter medicines.  Keep all follow-up appointments as directed by your caregiver. SEEK IMMEDIATE MEDICAL CARE IF:   You are unable to keep fluids down.  You do not urinate at least once every 6 to 8 hours.  You develop shortness of breath.  You notice blood in your stool or vomit. This may look like coffee grounds.  You have abdominal pain that increases or is concentrated in one small area (localized).  You have persistent vomiting or diarrhea.  You have a fever.  The patient is a child younger than 3 months, and he or she has a fever.  The patient is a child older than 3 months, and he or she has a fever and persistent symptoms.  The patient is a child older than 3 months, and he or she has a fever and symptoms suddenly get worse.  The patient is a baby, and he or she has no tears when crying. MAKE SURE YOU:   Understand these instructions.  Will watch your condition.  Will get help right away if you are not doing well or get worse.   This information  is not intended to replace advice given to you by your health care provider. Make sure you discuss any questions you have with your health care provider.   Document Released: 10/01/2005 Document Revised: 12/24/2011 Document Reviewed: 07/18/2011 Elsevier Interactive Patient Education [employer]

## 2015-12-13 NOTE — ED Notes (Signed)
Pt reports sudden onset of vomiting and explosive diarrhea x 4 am. Dad states he had same symptoms last week, seen here for "GI bug". Pt reports cramping pain across upper abd only prior to vomiting.

## 2016-10-31 ENCOUNTER — Emergency Department (HOSPITAL_BASED_OUTPATIENT_CLINIC_OR_DEPARTMENT_OTHER)
Admission: EM | Admit: 2016-10-31 | Discharge: 2016-10-31 | Disposition: A | Discharge status: Home / Self Care | Payer: 59 | Attending: Emergency Medicine | Admitting: Emergency Medicine

## 2016-10-31 ENCOUNTER — Emergency Department (HOSPITAL_BASED_OUTPATIENT_CLINIC_OR_DEPARTMENT_OTHER): Payer: 59

## 2016-10-31 ENCOUNTER — Encounter (HOSPITAL_BASED_OUTPATIENT_CLINIC_OR_DEPARTMENT_OTHER): Payer: Self-pay

## 2016-10-31 DIAGNOSIS — Y929 Unspecified place or not applicable: Secondary | ICD-10-CM | POA: Diagnosis not present

## 2016-10-31 DIAGNOSIS — Z79899 Other long term (current) drug therapy: Secondary | ICD-10-CM | POA: Diagnosis not present

## 2016-10-31 DIAGNOSIS — Y999 Unspecified external cause status: Secondary | ICD-10-CM | POA: Insufficient documentation

## 2016-10-31 DIAGNOSIS — S62366A Nondisplaced fracture of neck of fifth metacarpal bone, right hand, initial encounter for closed fracture: Secondary | ICD-10-CM | POA: Diagnosis not present

## 2016-10-31 DIAGNOSIS — F17210 Nicotine dependence, cigarettes, uncomplicated: Secondary | ICD-10-CM | POA: Insufficient documentation

## 2016-10-31 DIAGNOSIS — Y939 Activity, unspecified: Secondary | ICD-10-CM | POA: Insufficient documentation

## 2016-10-31 DIAGNOSIS — W228XXA Striking against or struck by other objects, initial encounter: Secondary | ICD-10-CM | POA: Insufficient documentation

## 2016-10-31 DIAGNOSIS — S6991XA Unspecified injury of right wrist, hand and finger(s), initial encounter: Secondary | ICD-10-CM | POA: Diagnosis present

## 2016-10-31 DIAGNOSIS — S62306A Unspecified fracture of fifth metacarpal bone, right hand, initial encounter for closed fracture: Secondary | ICD-10-CM

## 2016-10-31 NOTE — Discharge Instructions (Signed)
You were seen in the ED today with a fracture to the right hand. We placed you in a splint that will need to stay clean and dry. Take Tylenol for pain and keep the extremity elevated when at rest to reduce swelling. Call the orthopedic surgeon listed below tomorrow to set up appointment for Monday or Tuesday.   Return to the ED with any sudden severe pain in the hand, color change in the fingers, or new numbness/tingling.

## 2016-10-31 NOTE — ED Provider Notes (Signed)
Emergency Department Provider Note   I have reviewed the triage vital signs and the nursing notes.   HISTORY  Chief Complaint Fall   HPI Eric Fields is a 26 y.o. male presents to the ED for evaluation of right lateral hand pain after punching a refrigerator earlier this afternoon. At first he reported a fall but later confessed to punching the appliance. Denise punching anyone in the mouth. No head trauma. Denies numbness or tingling in the hand. No elbow pain. Reports some mild radiation of pain to the wrist. Pain is worse with movement.    Past Medical History:  Diagnosis Date  . BBB (bundle branch block)    right  . Bipolar affective, mixed (HCC)     There are no active problems to display for this patient.   Past Surgical History:  Procedure Laterality Date  . HERNIA REPAIR      Current Outpatient Rx  . Order #: [id number] Class: Historical Med  . Order #: [id number] Class: Historical Med  . Order #: [id number] Class: Historical Med    Allergies Patient has no known allergies.  No family history on file.  Social History Social History  Substance Use Topics  . Smoking status: Current Every Day Smoker    Packs/day: 1.00    Types: Cigarettes  . Smokeless tobacco: Never Used  . Alcohol use No    Review of Systems  Constitutional: No fever/chills Eyes: No visual changes. ENT: No sore throat. Cardiovascular: Denies chest pain. Respiratory: Denies shortness of breath. Gastrointestinal: No abdominal pain.  No nausea, no vomiting.  No diarrhea.  No constipation. Genitourinary: Negative for dysuria. Musculoskeletal: Negative for back pain. Positive right hand pain.  Skin: Negative for rash. Neurological: Negative for headaches, focal weakness or numbness.  10-point ROS otherwise negative.  ____________________________________________   PHYSICAL EXAM:  VITAL SIGNS: ED Triage Vitals  Enc Vitals Group     BP 10/31/16 1726 138/98     Pulse Rate  10/31/16 1726 95     Resp 10/31/16 1726 18     Temp 10/31/16 1726 98.7 F (37.1 C)     Temp Source 10/31/16 1726 Oral     SpO2 10/31/16 1726 97 %     Weight 10/31/16 1728 210 lb (95.3 kg)     Height 10/31/16 1728 6\' 2"  (1.88 m)     Pain Score 10/31/16 1724 6   Constitutional: Alert and oriented. Well appearing and in no acute distress. Eyes: Conjunctivae are normal.  Head: Atraumatic. Nose: No congestion/rhinnorhea. Mouth/Throat: Mucous membranes are moist.   Neck: No stridor.   Cardiovascular: Normal rate, regular rhythm. Good peripheral circulation. Grossly normal heart sounds.   Respiratory: Normal respiratory effort.  No retractions. Lungs CTAB. Musculoskeletal: No lower extremity tenderness nor edema. Right ahnd swelling and tenderness over the 4th and 5th metacarpal.  Neurologic:  Normal speech and language. No gross focal neurologic deficits are appreciated.  Skin:  Skin is warm, dry and intact. No rash noted. Psychiatric: Mood and affect are normal. Speech and behavior are normal.  ____________________________________________  RADIOLOGY  Dg Hand Complete Right  Result Date: 10/31/2016 CLINICAL DATA:  Right hand pain after a fall. EXAM: RIGHT HAND - COMPLETE 3+ VIEW COMPARISON:  None. FINDINGS: Study is markedly limited by superimposition of the fingers on the lateral film. Transverse fracture identified through the neck/head of the fifth metacarpal. No other acute bony abnormality identified. IMPRESSION: Distal fifth metacarpal fracture. Electronically Signed   By: Jamison Oka.D.  On: 10/31/2016 17:44    ____________________________________________   PROCEDURES  Procedure(s) performed:   Procedures  None ____________________________________________   INITIAL IMPRESSION / ASSESSMENT AND PLAN / ED COURSE  Pertinent labs & imaging results that were available during my care of the patient were reviewed by me and considered in my medical decision making (see  chart for details).  Patient presents to the ED with right hand pain and swelling in the setting of punching a fridge. No hand lacerations or concern for fight bite. Plan for plain film and re-eval. Clinically suspect fracture. The patient is right handed.   06:00 PM Patient with distal 5th metacarpal fx. Spoke with Dr. Mina MarbleWeingold with hand surgery who agrees with plan to place in ulnar gutter splint and see him in the office on Monday or Tuesday.   ____________________________________________  FINAL CLINICAL IMPRESSION(S) / ED DIAGNOSES  Final diagnoses:  Closed nondisplaced fracture of fifth metacarpal bone of right hand, unspecified portion of metacarpal, initial encounter     MEDICATIONS GIVEN DURING THIS VISIT:  None  NEW OUTPATIENT MEDICATIONS STARTED DURING THIS VISIT:  None   Note:  This document was prepared using Dragon voice recognition software and may include unintentional dictation errors.  Alona BeneJoshua Arkin Imran, MD Emergency Medicine   Maia PlanJoshua G Imanii Gosdin, MD 10/31/16 [phone]

## 2016-10-31 NOTE — ED Triage Notes (Signed)
C/o pain to right hand after trip/fall approx 1 hour PTA

## 2017-01-14 IMAGING — CT CT ABD-PELV W/ CM
2 of 4 series · 16 of 46 positions shown, 18 images · IV contrast (APPLIED)
Comparison: None.

CLINICAL DATA: Nausea and vomiting for several hours

EXAM:
CT ABDOMEN AND PELVIS WITH CONTRAST
TECHNIQUE: Multidetector CT imaging of the abdomen and pelvis was performed
using the standard protocol following bolus administration of
intravenous contrast.
CONTRAST:  100mL OMNIPAQUE IOHEXOL 300 MG/ML SOLN, 25mL OMNIPAQUE
IOHEXOL 300 MG/ML SOLN

[Series 2: axial st · axial · 0.81mm/px · z∈[+761,+1241]mm · 13 of 106 slices shown, 15 images]
[im 5/106  soft-tissue]
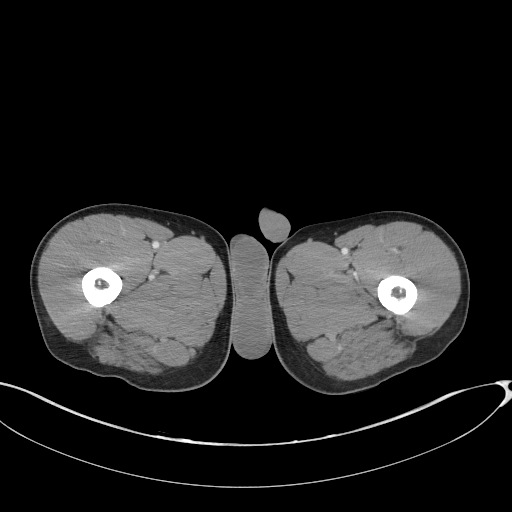
[im 5/106  bone]
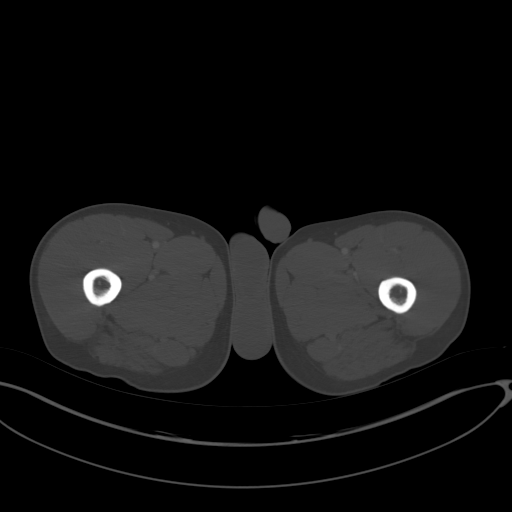
[im 13/106  soft-tissue]
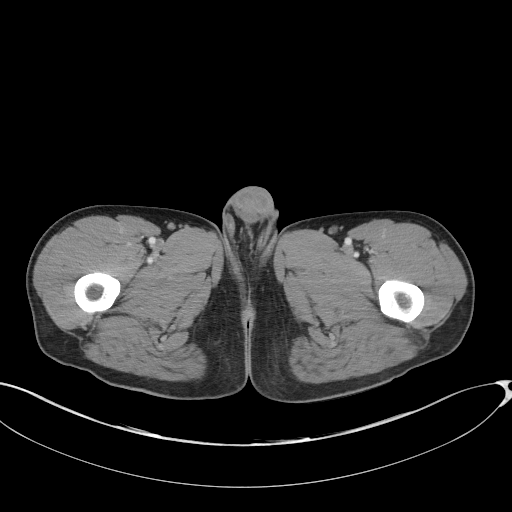
[im 21/106  soft-tissue]
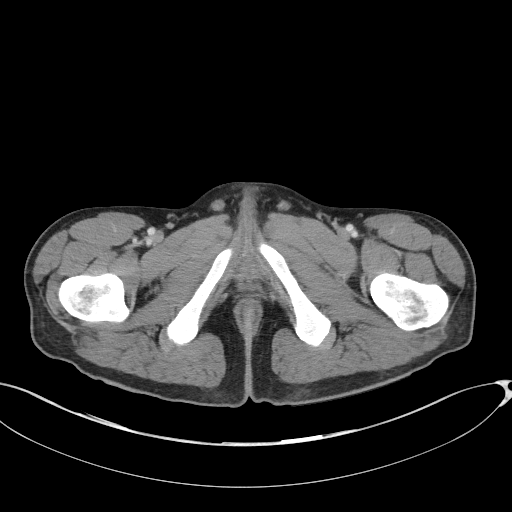
[im 29/106  soft-tissue]
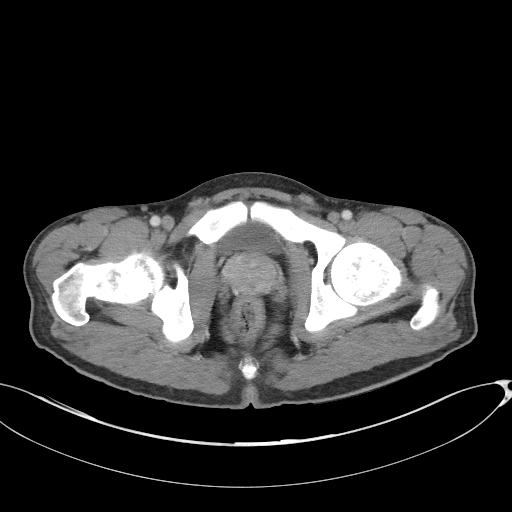
[im 37/106  soft-tissue]
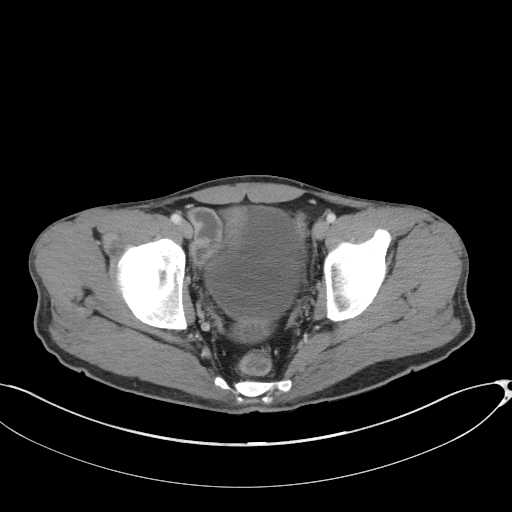
[im 45/106  soft-tissue]
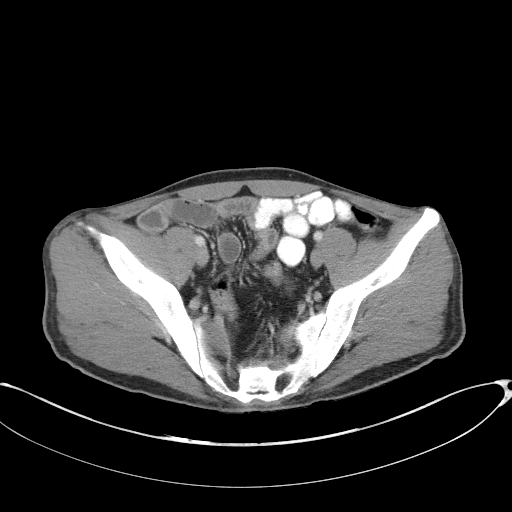
[im 53/106  soft-tissue]
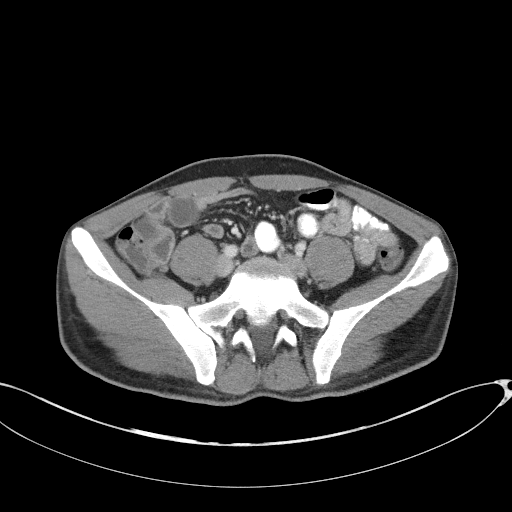
[im 61/106  soft-tissue]
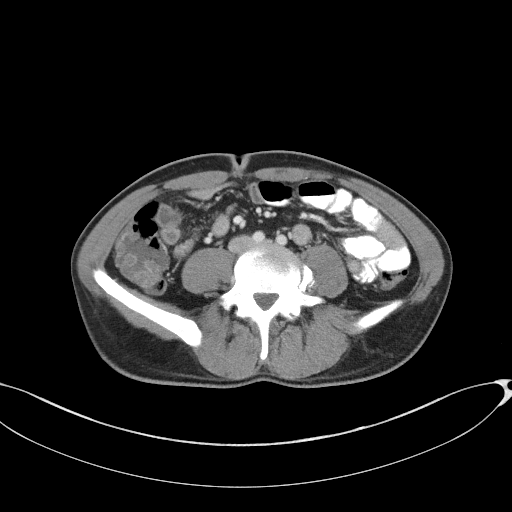
[im 69/106  soft-tissue]
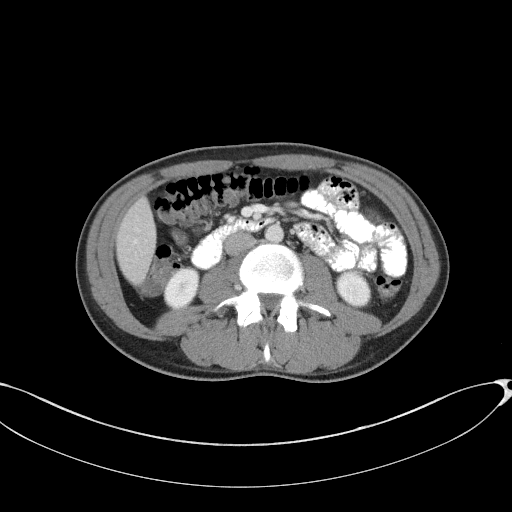
[im 69/106  bone]
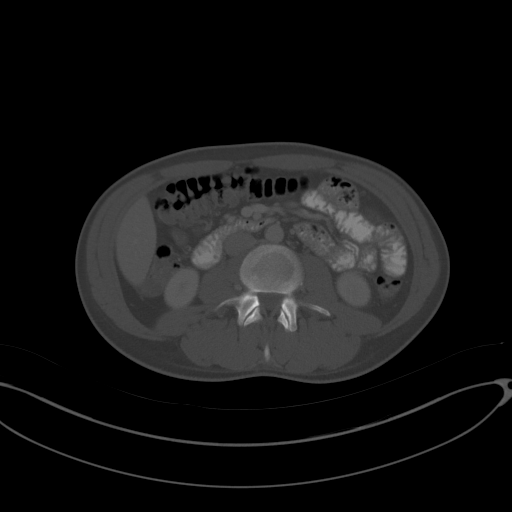
[im 77/106  soft-tissue]
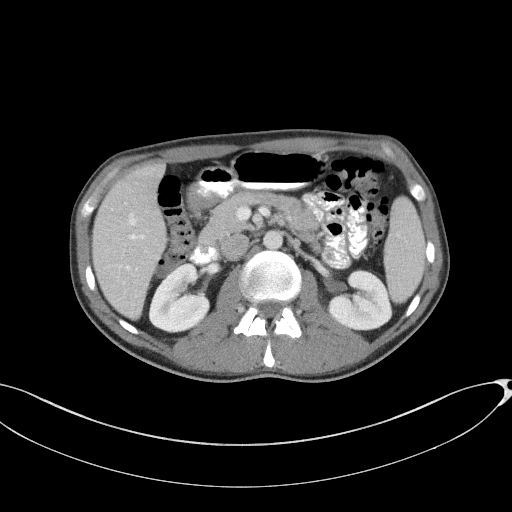
[im 85/106  soft-tissue]
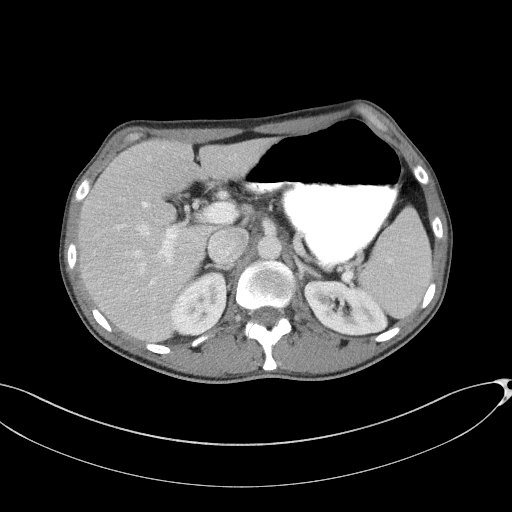
[im 93/106  soft-tissue]
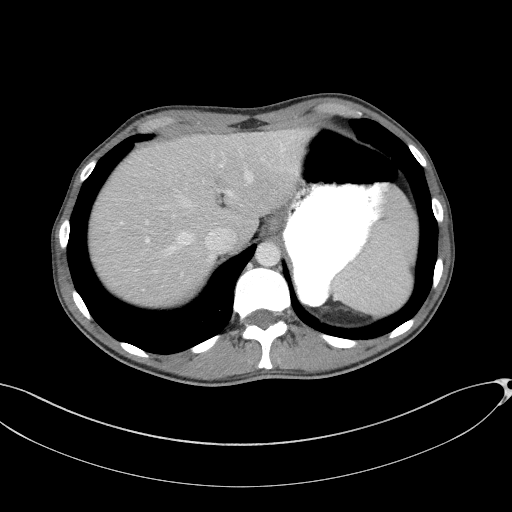
[im 101/106  soft-tissue]
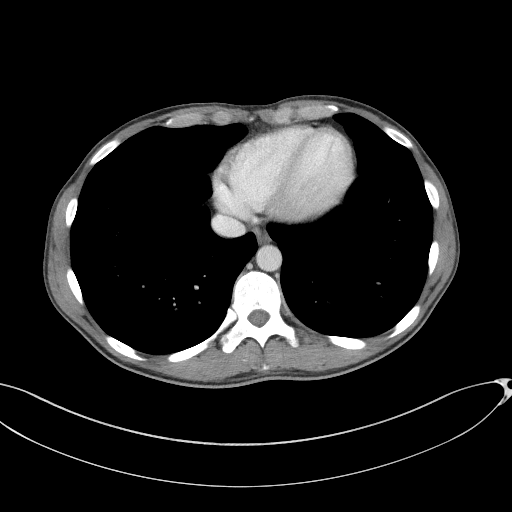

[Series 5: coronal st · coronal · 0.81mm/px · 3 of 93 slices shown]
[im 31/93  soft-tissue]
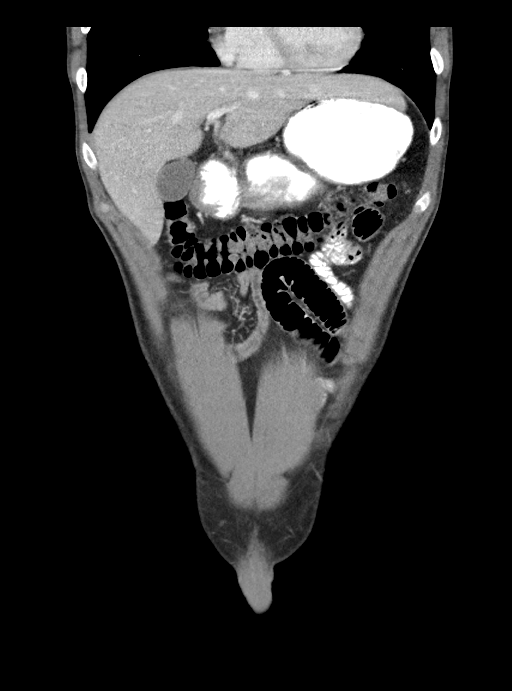
[im 41/93  soft-tissue]
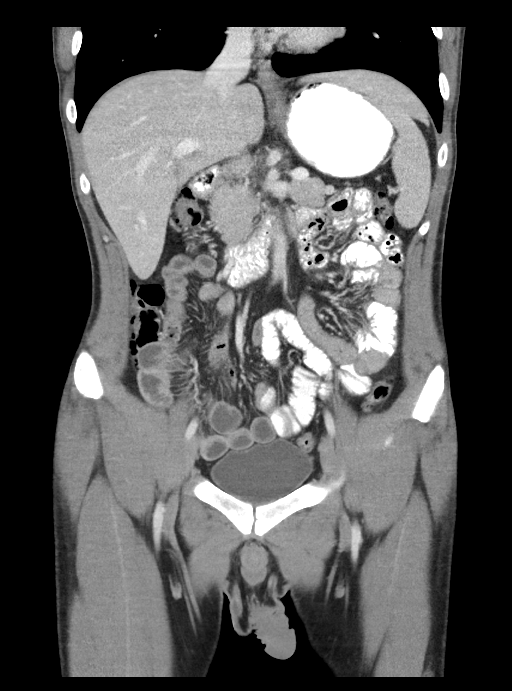
[im 52/93  soft-tissue]
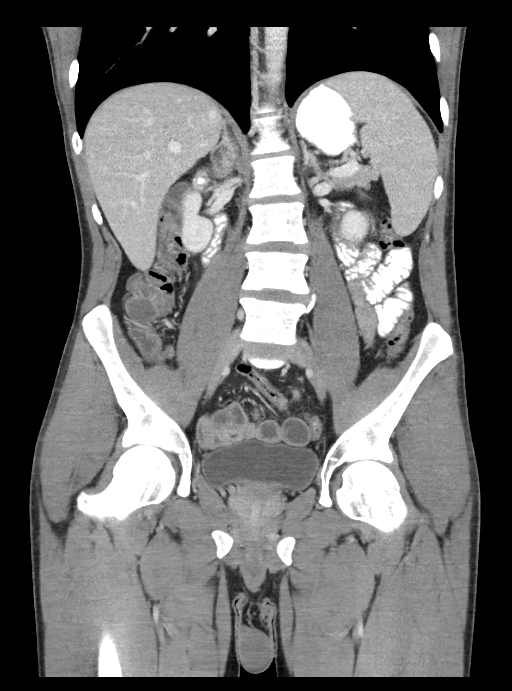

[16 of 46 positions shown; findings below may reference images not displayed]

FINDINGS: Lung bases are free of acute infiltrate or sizable effusion.

The liver, gallbladder, spleen, adrenal glands and pancreas are
normal in their CT appearance. The kidneys are well visualized
bilaterally and reveal no evidence of renal calculi or urinary tract
obstructive changes.

The appendix is well visualized and within normal limits. No
obstructive changes are noted in the large and small bowel. No
significant inflammatory changes are seen. Fluid-filled loops of
distal small bowel are noted consistent with the patient's given
clinical history

The bladder is well distended. The prostate is within normal limits.
No pelvic mass lesion or sidewall abnormality is noted. The osseous
structures show no acute abnormality.
IMPRESSION: Fluid-filled loops of distal small bowel without significant
dilatation. This is consistent with the patient's given clinical
history and may represent a degree of mild enteritis. No significant
inflammatory changes are noted.

No other focal abnormality is noted.

## 2023-01-30 ENCOUNTER — Ambulatory Visit: Payer: BLUE CROSS/BLUE SHIELD | Admitting: Podiatry

## 2023-09-25 ENCOUNTER — Encounter (HOSPITAL_COMMUNITY): Payer: Self-pay

## 2023-09-25 ENCOUNTER — Emergency Department (HOSPITAL_COMMUNITY): Payer: BLUE CROSS/BLUE SHIELD

## 2023-09-25 ENCOUNTER — Other Ambulatory Visit: Payer: Self-pay

## 2023-09-25 ENCOUNTER — Emergency Department (HOSPITAL_COMMUNITY)
Admission: EM | Admit: 2023-09-25 | Discharge: 2023-09-25 | Disposition: A | Discharge status: Home / Self Care | Payer: BLUE CROSS/BLUE SHIELD | Attending: Emergency Medicine | Admitting: Emergency Medicine

## 2023-09-25 DIAGNOSIS — R55 Syncope and collapse: Secondary | ICD-10-CM | POA: Insufficient documentation

## 2023-09-25 DIAGNOSIS — W108XXA Fall (on) (from) other stairs and steps, initial encounter: Secondary | ICD-10-CM | POA: Diagnosis not present

## 2023-09-25 DIAGNOSIS — S99912A Unspecified injury of left ankle, initial encounter: Secondary | ICD-10-CM | POA: Diagnosis present

## 2023-09-25 MED ORDER — ACETAMINOPHEN 500 MG PO TABS
1000.0000 mg | ORAL_TABLET | Freq: Once | ORAL | Status: AC
  Administered 2023-09-25: 1000 mg via ORAL
  Filled 2023-09-25: qty 2

## 2023-09-25 NOTE — ED Notes (Signed)
Voicemail left with ortho tech for CAM boot and crutches for pt. Awaiting call back at this time.

## 2023-09-25 NOTE — ED Provider Notes (Addendum)
[place] EMERGENCY DEPARTMENT AT [facility] Provider Note   CSN: [id number] Arrival date & time: 09/25/23  1731     History  Chief Complaint  Patient presents with   Marletta Lor    Eric Fields is a 32 y.o. male   Past medical history of bipolar 1 disorder.  The patient was coming down the steps when he jumped from the second to last step onto his left ankle.  He immediately felt/heard a pop or crack and had left ankle pain.  He was able to catch himself with his right foot, and denies fall.  He was able to sit down where he felt that he would pass out, and subsequently had loss of consciousness.  When he awoke he felt warm and nauseated.  Denies any headache, chest pain, or shortness of breath.  Denies any fevers chills, night sweats, constipation, diarrhea, or other symptoms besides his left ankle pain.  He rates the pain currently at a 3 out of 10.  Endorses a bit of tingling in the toes of the left foot, but otherwise denies weakness or numbness.  He would like to avoid opioid medications.   Fall Pertinent negatives include no chest pain, no abdominal pain and no shortness of breath.       Home Medications Prior to Admission medications   Medication Sig Start Date End Date Taking? Authorizing Provider  clorazepate (TRANXENE) 7.5 MG tablet Take 7.5 mg by mouth 2 (two) times daily as needed for anxiety.    [provider]  lamoTRIgine (LAMICTAL) 25 MG tablet Take 25 mg by mouth daily.    [provider]  OLANZapine (ZYPREXA) 20 MG tablet Take 20 mg by mouth at bedtime.    [provider]      Allergies    Patient has no known allergies.    Review of Systems   Review of Systems  Constitutional:  Negative for chills, fatigue and fever.  Respiratory:  Negative for shortness of breath.   Cardiovascular:  Negative for chest pain.  Gastrointestinal:  Negative for abdominal pain, blood in stool, constipation and diarrhea.  Musculoskeletal:   Positive for joint swelling.    Physical Exam Updated Vital Signs BP (!) 133/91   Pulse 81   Resp (!) 9   Ht 6\' 2"  (1.88 m)   Wt 95 kg   SpO2 96%   BMI 26.89 kg/m  Physical Exam Constitutional:      Appearance: Normal appearance.  Cardiovascular:     Rate and Rhythm: Normal rate and regular rhythm.     Pulses: Normal pulses.     Heart sounds: Normal heart sounds.  Pulmonary:     Effort: Pulmonary effort is normal.     Breath sounds: Normal breath sounds.  Abdominal:     General: Abdomen is flat.     Tenderness: There is no abdominal tenderness. There is no guarding.  Musculoskeletal:     Comments: Significant swelling of the anterior lateral aspect of the left ankle.  Good dorsalis pedis and posterior tibial artery pulses bilaterally.  Capillary refill maintained bilaterally.  Able to wiggle the toes of the left foot, no focal loss of sensation.  Skin:    General: Skin is warm and dry.  Neurological:     General: No focal deficit present.     Mental Status: He is alert. Mental status is at baseline.     Cranial Nerves: No cranial nerve deficit.     Sensory: No  sensory deficit.  Psychiatric:        Mood and Affect: Mood normal.        Behavior: Behavior normal.     ED Results / Procedures / Treatments   Labs (all labs ordered are listed, but only abnormal results are displayed) Labs Reviewed - No data to display  EKG EKG Interpretation Date/Time:  Wednesday September 25 2023 17:47:31 EST Ventricular Rate:  80 PR Interval:  157 QRS Duration:  104 QT Interval:  378 QTC Calculation: 436 R Axis:   22  Text Interpretation: Sinus rhythm when compared to prior, similar appearanc.e NO STEMI Confirmed by Theda Belfast ([id number]) on 09/25/2023 5:56:19 PM  Radiology DG Ankle 2 Views Left  Result Date: 09/25/2023 CLINICAL DATA:  Left ankle injury. EXAM: LEFT ANKLE - 2 VIEW COMPARISON:  None Available. FINDINGS: There is no acute fracture or dislocation. The bones are  well mineralized. No arthritic changes. The ankle mortise is intact. There is soft tissue swelling over the lateral malleolus. No radiopaque foreign object or soft tissue gas. IMPRESSION: 1. No acute fracture or dislocation. 2. Lateral soft tissue swelling. Electronically Signed   By: Elgie Collard M.D.   On: 09/25/2023 19:15    Procedures Procedures    Medications Ordered in ED Medications  acetaminophen (TYLENOL) tablet 1,000 mg (1,000 mg Oral Given 09/25/23 1809)    ED Course/ Medical Decision Making/ A&P                                 Medical Decision Making This is a 32 year old gentleman presenting with left ankle pain and swelling following left ankle injury.  He does have impressive swelling of the left ankle, but is otherwise neurovascularly intact.  No pain of the left knee, no signs or symptoms to suggest cardiac pathology or head trauma with this loss of consciousness.  Based on his story he most likely had a vasovagal episode.  EKG normal today.  Will go ahead and give him some Tylenol for pain control as well as obtain some plain films of the left ankle.  Given that his loss of consciousness seems to be vasovagal and and the patient denies any trauma to the head we will go ahead and defer further syncope workup / head imaging at this time.  X-rays negative for fracture.  Given the large amount of swelling as well as his history, I do suspect that he may have an occult fracture or ligamentous injury.  We will discharge with CAM boot, crutches and ortho follow up.  Pain well-managed with over-the-counter medications at this time.  Amount and/or Complexity of Data Reviewed Radiology: ordered.  Risk OTC drugs.           Final Clinical Impression(s) / ED Diagnoses Final diagnoses:  Injury of left ankle, initial encounter  Syncope, unspecified syncope type    Rx / DC Orders ED Discharge Orders          Ordered    Apply cam walker  Status:  Canceled         09/25/23 1927    DME Crutches  Status:  Canceled        09/25/23 1927              Lovie Macadamia, MD 09/25/23 [id number]    Lovie Macadamia, MD 09/25/23 2001    Tegeler, Canary Brim, MD 09/25/23 2349

## 2023-09-25 NOTE — Discharge Instructions (Addendum)
You are seen today after hurting your ankle and passing out.  We feel that you are loss of consciousness was likely due to vasovagal syncope, and unrelated to any heart or brain pathology.   Please call the orthopaedic doctor to follow up:   Dr. Truitt Merle [address] [place], [place] [address] Phone: [phone]

## 2023-09-25 NOTE — Progress Notes (Signed)
Orthopedic Tech Progress Note Patient Details:  Eric Fields 01/04/1991 [id number]  Cam walker boot applied, crutch training provided  Ortho Devices Type of Ortho Device: CAM walker, Crutches Ortho Device/Splint Location: LLE Ortho Device/Splint Interventions: Ordered, Application, Adjustment   Post Interventions Patient Tolerated: Well Instructions Provided: Adjustment of device, Care of device  Diannia Ruder 09/25/2023, 9:00 PM

## 2023-09-25 NOTE — ED Triage Notes (Signed)
Pt to the ed from home via ems with a CC of fall with with LOC. Pt relays left leg pain with swelling. Pt denies hitting head neck/ back pain. Pt a&Ox4
